# Patient Record
Sex: Female | Born: 1965 | Race: White | Hispanic: No | Marital: Married | Smoking: Never smoker
Health system: Southern US, Community
[De-identification: ages and names within clinical notes are randomized; demographics above are authoritative.]

---

## 2021-12-20 ENCOUNTER — Ambulatory Visit
Admission: EM | Admit: 2021-12-20 | Discharge: 2021-12-20 | Disposition: A | Discharge status: Home / Self Care | Payer: BC Managed Care – PPO | Attending: Student | Admitting: Student

## 2021-12-20 ENCOUNTER — Ambulatory Visit (INDEPENDENT_AMBULATORY_CARE_PROVIDER_SITE_OTHER): Payer: BC Managed Care – PPO

## 2021-12-20 DIAGNOSIS — R059 Cough, unspecified: Secondary | ICD-10-CM | POA: Diagnosis not present

## 2021-12-20 DIAGNOSIS — R051 Acute cough: Secondary | ICD-10-CM

## 2021-12-20 MED ORDER — PREDNISONE 10 MG (21) PO TBPK: ORAL_TABLET | Freq: Every day | ORAL | 0 refills | Status: AC

## 2021-12-20 NOTE — Discharge Instructions (Addendum)
-  Prednisone taper for cough/bronchitis. I recommend taking this in the morning as it could give you energy.  Avoid NSAIDs like ibuprofen and alleve while taking this medication as they can increase your risk of stomach upset and even GI bleeding when in combination with a steroid. You can continue tylenol (acetaminophen) up to 1000mg  3x daily. -Follow-up if symptoms change - new shortness of breath, fevers, chest pain, coughing up dark or red sputum, etc.

## 2021-12-20 NOTE — ED Triage Notes (Signed)
Patient presents to [facility] fo cough -- she reported that she was spraying bug spray Monday and the wind blew and blew it back in her face.

## 2023-06-04 IMAGING — CR DG CHEST 2V
2 series · 2 of 2 positions shown · non-contrast
Comparison: None Available.

CLINICAL DATA: Cough.

EXAM:
CHEST - 2 VIEW

[chest pa]
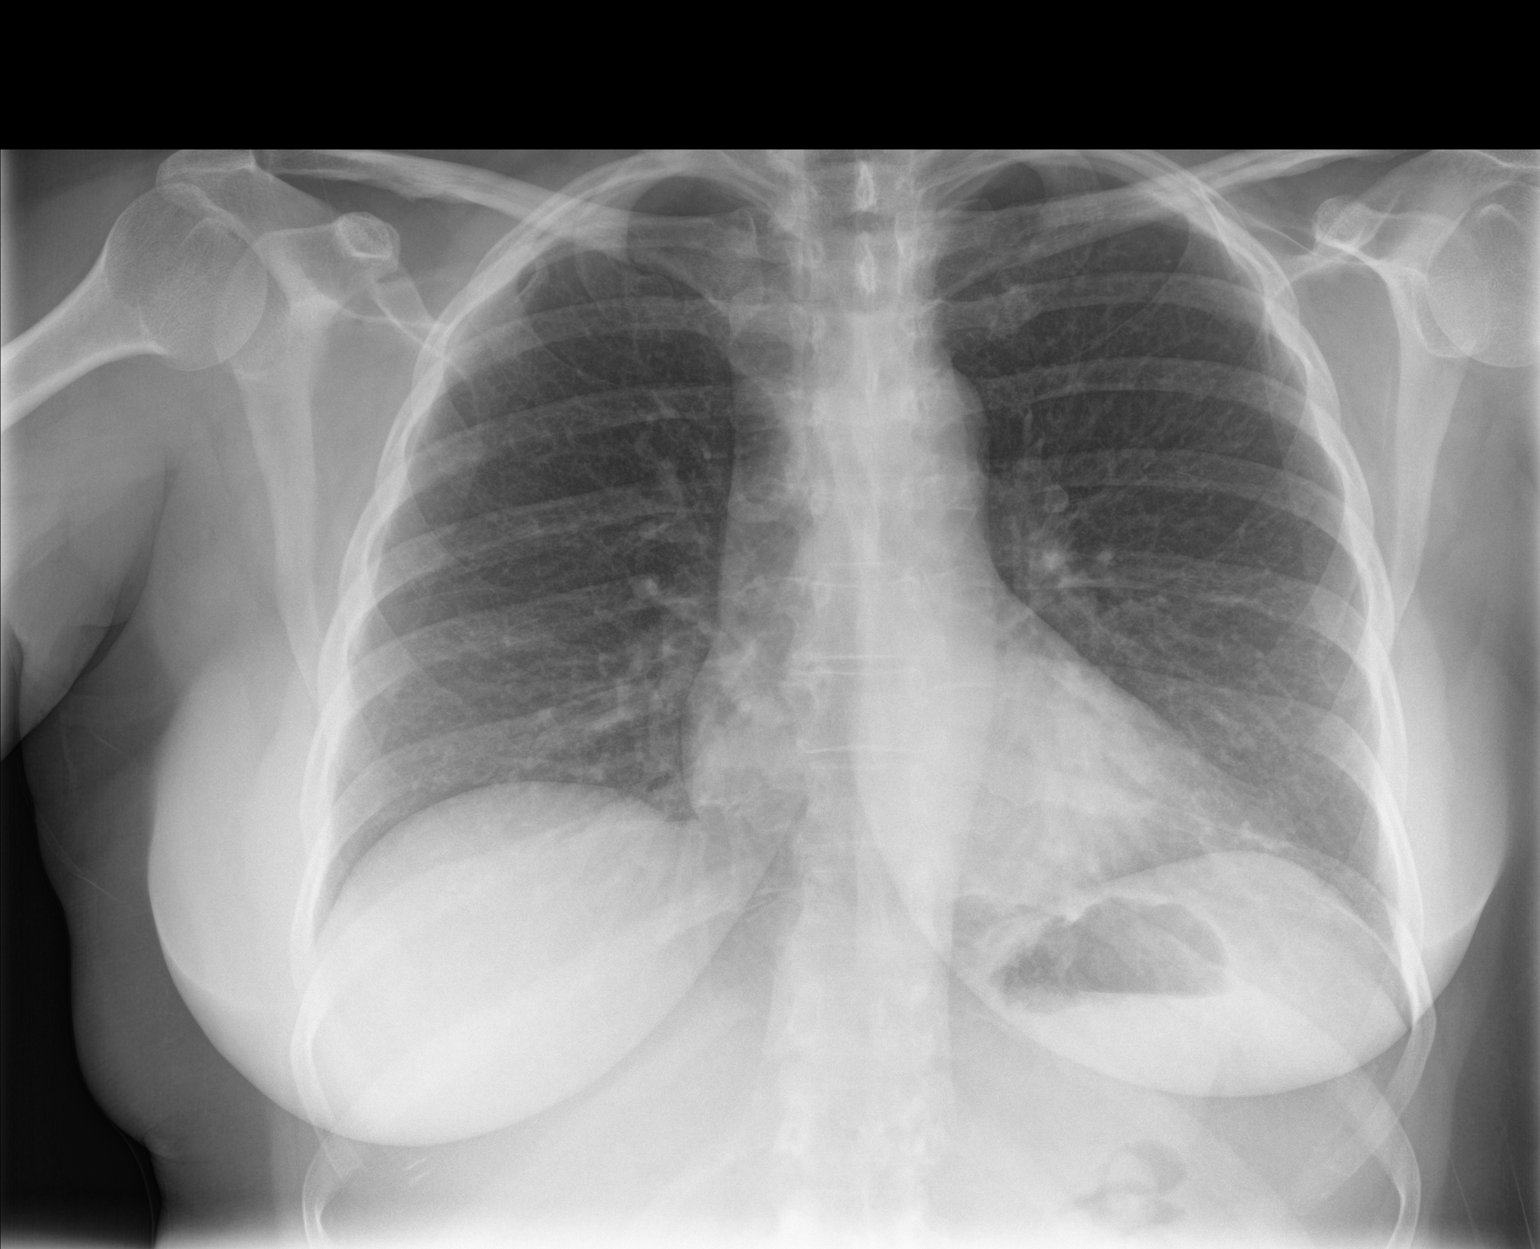

[chest lat]
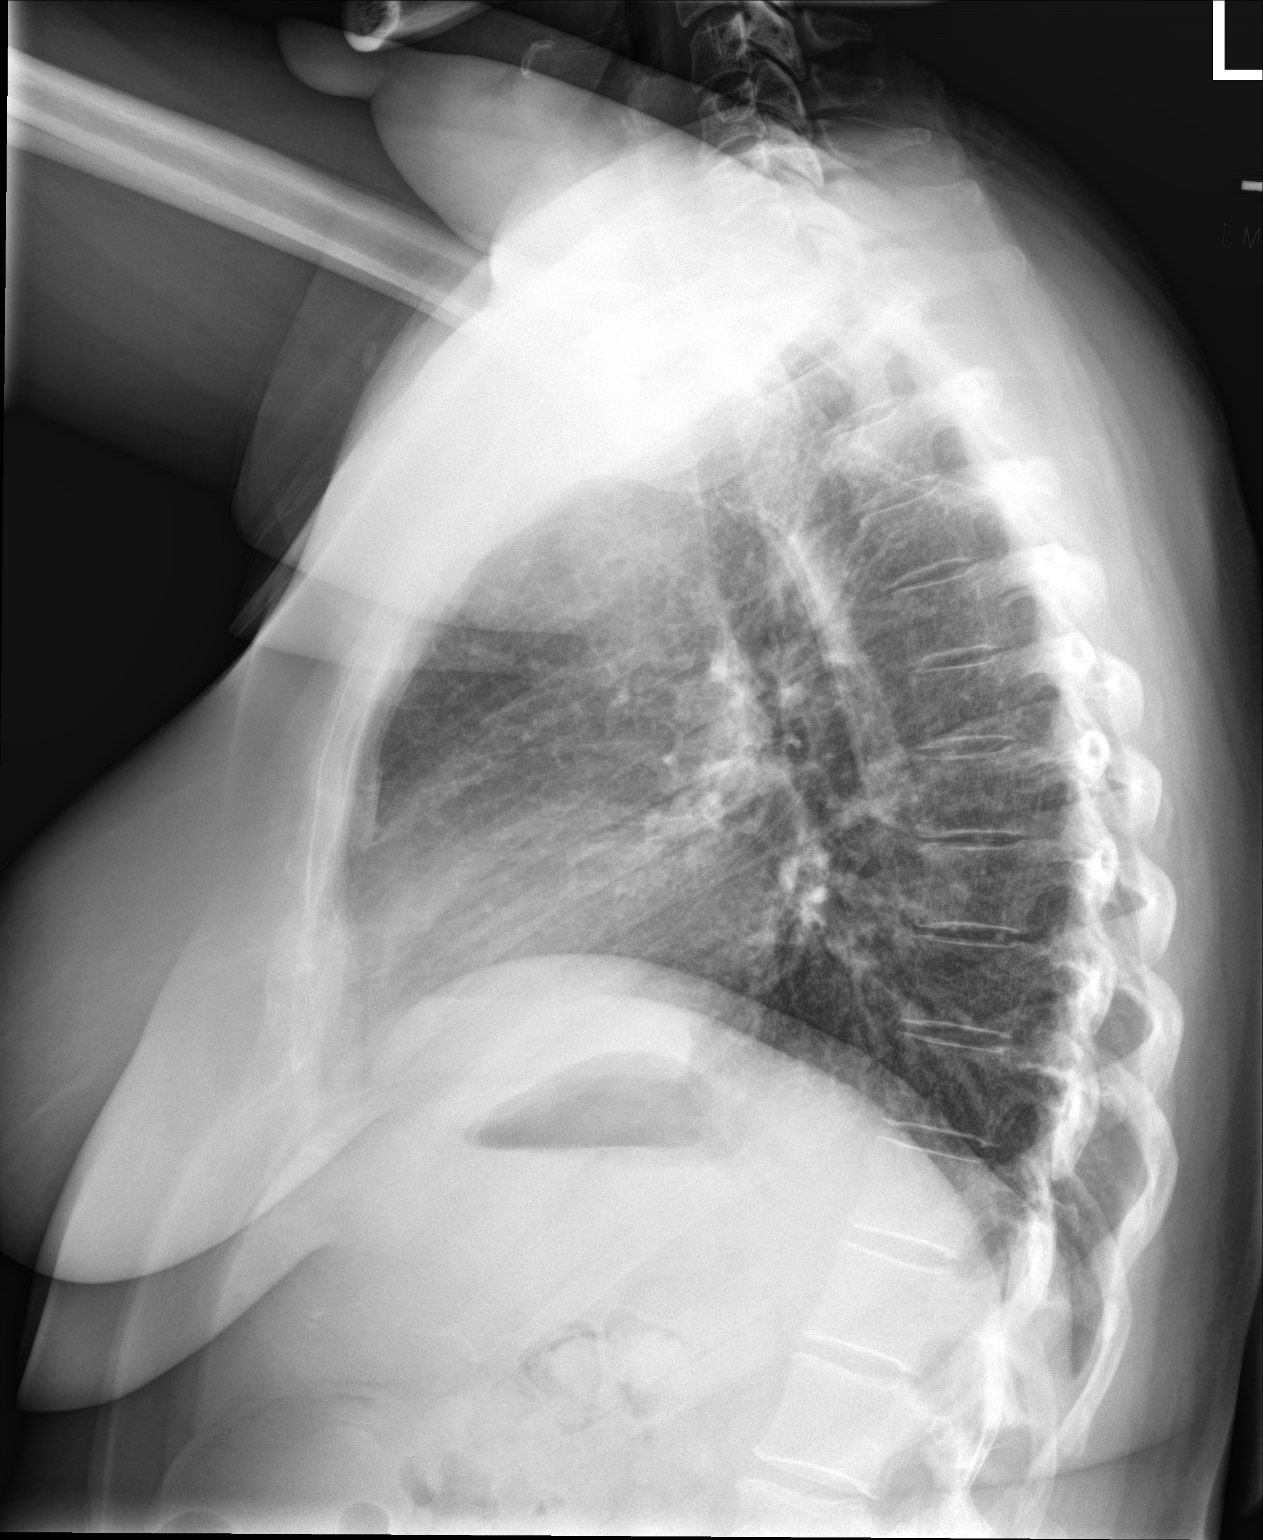

[2 of 2 positions shown; findings below may reference images not displayed]

FINDINGS: The heart size and mediastinal contours are within normal limits.
Both lungs are clear. The visualized skeletal structures are
unremarkable.
IMPRESSION: No active cardiopulmonary disease.
